# Patient Record
Sex: Female | Born: 1990 | Race: White | Hispanic: No | Marital: Single | Smoking: Current every day smoker
Health system: Southern US, Community
[De-identification: ages and names within clinical notes are randomized; demographics above are authoritative.]

## PROBLEM LIST (undated history)

## (undated) DIAGNOSIS — F909 Attention-deficit hyperactivity disorder, unspecified type: Secondary | ICD-10-CM

## (undated) DIAGNOSIS — F32A Depression, unspecified: Secondary | ICD-10-CM

## (undated) DIAGNOSIS — I10 Essential (primary) hypertension: Secondary | ICD-10-CM

## (undated) DIAGNOSIS — F329 Major depressive disorder, single episode, unspecified: Secondary | ICD-10-CM

## (undated) DIAGNOSIS — F988 Other specified behavioral and emotional disorders with onset usually occurring in childhood and adolescence: Secondary | ICD-10-CM

## (undated) DIAGNOSIS — F319 Bipolar disorder, unspecified: Secondary | ICD-10-CM

## (undated) HISTORY — PX: MOUTH SURGERY: SHX715

## (undated) HISTORY — PX: EXTERNAL EAR SURGERY: SHX627

---

## 2008-01-28 ENCOUNTER — Emergency Department: Payer: Self-pay | Admitting: Emergency Medicine

## 2010-06-28 ENCOUNTER — Emergency Department: Payer: Self-pay | Admitting: Emergency Medicine

## 2011-02-04 ENCOUNTER — Emergency Department: Payer: Self-pay | Admitting: Emergency Medicine

## 2011-09-18 ENCOUNTER — Emergency Department (HOSPITAL_COMMUNITY)
Admission: EM | Admit: 2011-09-18 | Discharge: 2011-09-18 | Disposition: A | Discharge status: Home / Self Care | Payer: Medicare Other | Attending: Emergency Medicine | Admitting: Emergency Medicine

## 2011-09-18 ENCOUNTER — Encounter (HOSPITAL_COMMUNITY): Payer: Self-pay | Admitting: *Deleted

## 2011-09-18 DIAGNOSIS — F3289 Other specified depressive episodes: Secondary | ICD-10-CM | POA: Insufficient documentation

## 2011-09-18 DIAGNOSIS — F329 Major depressive disorder, single episode, unspecified: Secondary | ICD-10-CM | POA: Insufficient documentation

## 2011-09-18 LAB — COMPREHENSIVE METABOLIC PANEL
AST: 17 U/L (ref 0–37)
BUN: 10 mg/dL (ref 6–23)
CO2: 27 mEq/L (ref 19–32)
Calcium: 9.4 mg/dL (ref 8.4–10.5)
Chloride: 106 mEq/L (ref 96–112)
Creatinine, Ser: 0.9 mg/dL (ref 0.50–1.10)
GFR calc Af Amer: >90 mL/min (ref >90)
GFR calc non Af Amer: >90 mL/min (ref >90)
Total Bilirubin: 0.9 mg/dL (ref 0.3–1.2)

## 2011-09-18 LAB — RAPID URINE DRUG SCREEN, HOSP PERFORMED
Amphetamines: NOT DETECTED
Benzodiazepines: NOT DETECTED
Opiates: NOT DETECTED

## 2011-09-18 LAB — CBC
HCT: 42.7 % (ref 36.0–46.0)
Hemoglobin: 15 g/dL (ref 12.0–15.0)
MCHC: 35.1 g/dL (ref 30.0–36.0)
MCV: 90.7 fL (ref 78.0–100.0)
RDW: 12.1 % (ref 11.5–15.5)

## 2011-09-18 LAB — DIFFERENTIAL
Basophils Absolute: 0 10*3/uL (ref 0.0–0.1)
Basophils Relative: 1 % (ref 0–1)
Eosinophils Relative: 1 % (ref 0–5)
Lymphocytes Relative: 26 % (ref 12–46)
Monocytes Absolute: 0.4 10*3/uL (ref 0.1–1.0)
Monocytes Relative: 7 % (ref 3–12)
Neutro Abs: 3.9 10*3/uL (ref 1.7–7.7)
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
## 2011-09-18 NOTE — ED Provider Notes (Addendum)
History     CSN: [id number]  Arrival date & time 09/18/11  5621   First MD Initiated Contact with Patient 09/18/11 1025      Chief Complaint  Patient presents with  . V70.1    (Consider location/radiation/quality/duration/timing/severity/associated sxs/prior treatment) HPI.... patient has known psychiatric problems.  She has been cutting on her hands but she does not want to kill herself. 3 previous psychiatric admissions. Lives in group home.  Has had trouble getting along with her family. Nothing makes it better or worse.  She thinks she needs a medication adjustment  History reviewed. No pertinent past medical history.  History reviewed. No pertinent past surgical history.  No family history on file.  History  Substance Use Topics  . Smoking status: Never Smoker   . Smokeless tobacco: Not on file  . Alcohol Use: No    OB History    Grav Para Term Preterm Abortions TAB SAB Ect Mult Living                  Review of Systems  All other systems reviewed and are negative.    Allergies  Review of patient's allergies indicates no known allergies.  Home Medications   Current Outpatient Rx  Name Route Sig Dispense Refill  . ARIPIPRAZOLE 30 MG PO TABS Oral Take 45 mg by mouth daily. Pt takes 1 and 1/2 tab of 30 mg to equal 45mg  dose.    Marland Kitchen ATOMOXETINE HCL 40 MG PO CAPS Oral Take 40 mg by mouth daily.    Marland Kitchen FLUOXETINE HCL 10 MG PO TABS Oral Take 5 mg by mouth daily. Pt takes 1/2 tab for 5 mg dose    . PORTIA-28 PO Oral Take 1 tablet by mouth daily.    Marland Kitchen PROPRANOLOL HCL 60 MG PO TABS Oral Take 120-180 mg by mouth 3 (three) times daily. Pt takes 2 in the morning. 3 tabs every morning    . RANITIDINE HCL 150 MG PO TABS Oral Take 150 mg by mouth 2 (two) times daily.    . TRIHEXYPHENIDYL HCL 5 MG PO TABS Oral Take 5 mg by mouth 2 (two) times daily with a meal.      BP 100/65  Pulse 74  Temp(Src) 98.2 F (36.8 C) (Oral)  Resp 18  Ht 4\' 11"  (1.499 m)  Wt 154 lb (69.854  kg)  BMI 31.10 kg/m2  SpO2 100%  LMP 09/18/2011  Physical Exam  Nursing note and vitals reviewed. Constitutional: She is oriented to person, place, and time. She appears well-developed and well-nourished.  HENT:  Head: Normocephalic and atraumatic.  Eyes: Conjunctivae and EOM are normal. Pupils are equal, round, and reactive to light.  Neck: Normal range of motion. Neck supple.  Cardiovascular: Normal rate and regular rhythm.   Pulmonary/Chest: Effort normal and breath sounds normal.  Abdominal: Soft. Bowel sounds are normal.  Musculoskeletal: Normal range of motion.  Neurological: She is alert and oriented to person, place, and time.  Skin: Skin is warm and dry.       Superficial scratches on dorsum of bilateral  Psychiatric:       Flat affect, depressed    ED Course  Procedures (including critical care time)   Labs Reviewed  CBC  DIFFERENTIAL  URINE RAPID DRUG SCREEN (HOSP PERFORMED)  ETHANOL  COMPREHENSIVE METABOLIC PANEL   No results found.   No diagnosis found.    MDM  Will obtain [facility] consult. Most likely admission   Behavioral health consult.  No suicidal or homicidal ideation. Can return to group home     Donnetta Hutching, MD 09/18/11 1104  Donnetta Hutching, MD 09/18/11 1215

## 2011-09-18 NOTE — BH Assessment (Addendum)
CSW recvd call from [facility] with [facility]. Dawn is bedside speaking with patient at this time.  Ileene Hutchinson , MSW, LCSWA 09/18/2011 11:41 AM [phone]  CSW spoke with patient and she would like to be discharged. Patient does not exhibit any SI/HI/AVH. Patient is cleared for discharge. Discussed with [facility] on call worker who agrees with disposition. CSW contacted Clydie Braun, group home [employer] [employer], who will come to pick patient up.  CSW advised patient. EDP and patient's nurse notified of discharge.  Ileene Hutchinson , MSW, LCSWA 09/18/2011 12:17 PM [phone]

## 2013-08-12 ENCOUNTER — Emergency Department (HOSPITAL_COMMUNITY)
Admission: EM | Admit: 2013-08-12 | Discharge: 2013-08-12 | Disposition: A | Discharge status: Home / Self Care | Payer: Medicare Other | Attending: Emergency Medicine | Admitting: Emergency Medicine

## 2013-08-12 ENCOUNTER — Encounter (HOSPITAL_COMMUNITY): Payer: Self-pay | Admitting: Emergency Medicine

## 2013-08-12 ENCOUNTER — Emergency Department (HOSPITAL_COMMUNITY): Payer: Medicare Other

## 2013-08-12 DIAGNOSIS — Y9389 Activity, other specified: Secondary | ICD-10-CM | POA: Insufficient documentation

## 2013-08-12 DIAGNOSIS — W2209XA Striking against other stationary object, initial encounter: Secondary | ICD-10-CM | POA: Insufficient documentation

## 2013-08-12 DIAGNOSIS — F329 Major depressive disorder, single episode, unspecified: Secondary | ICD-10-CM | POA: Insufficient documentation

## 2013-08-12 DIAGNOSIS — Z79899 Other long term (current) drug therapy: Secondary | ICD-10-CM | POA: Insufficient documentation

## 2013-08-12 DIAGNOSIS — S60222A Contusion of left hand, initial encounter: Secondary | ICD-10-CM

## 2013-08-12 DIAGNOSIS — Z23 Encounter for immunization: Secondary | ICD-10-CM | POA: Insufficient documentation

## 2013-08-12 DIAGNOSIS — F3289 Other specified depressive episodes: Secondary | ICD-10-CM | POA: Insufficient documentation

## 2013-08-12 DIAGNOSIS — S60229A Contusion of unspecified hand, initial encounter: Secondary | ICD-10-CM | POA: Insufficient documentation

## 2013-08-12 DIAGNOSIS — Y9210 Unspecified residential institution as the place of occurrence of the external cause: Secondary | ICD-10-CM | POA: Insufficient documentation

## 2013-08-12 DIAGNOSIS — S60512A Abrasion of left hand, initial encounter: Secondary | ICD-10-CM

## 2013-08-12 DIAGNOSIS — IMO0002 Reserved for concepts with insufficient information to code with codable children: Secondary | ICD-10-CM | POA: Insufficient documentation

## 2013-08-12 HISTORY — DX: Depression, unspecified: F32.A

## 2013-08-12 HISTORY — DX: Major depressive disorder, single episode, unspecified: F32.9

## 2013-08-12 MED ORDER — TETANUS-DIPHTH-ACELL PERTUSSIS 5-2.5-18.5 LF-MCG/0.5 IM SUSP
0.5000 mL | Freq: Once | INTRAMUSCULAR | Status: AC
  Administered 2013-08-12: 0.5 mL via INTRAMUSCULAR
  Filled 2013-08-12: qty 0.5

## 2013-08-12 MED ORDER — HYDROCODONE-ACETAMINOPHEN 5-325 MG PO TABS
1.0000 | ORAL_TABLET | Freq: Once | ORAL | Status: AC
  Administered 2013-08-12: 1 via ORAL
  Filled 2013-08-12: qty 1
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
## 2013-08-12 NOTE — ED Provider Notes (Signed)
CSN: [id number]     Arrival date & time 08/12/13  1631 History   First MD Initiated Contact with Patient 08/12/13 1641     Chief Complaint  Patient presents with  . Hand Pain   (Consider location/radiation/quality/duration/timing/severity/associated sxs/prior Treatment) Patient is a 22 y.o. female presenting with hand pain. The history is provided by the patient.  Hand Pain Pertinent negatives include no chest pain, no abdominal pain, no headaches and no shortness of breath.  pt c/o pain to dorsum left hand after punching a wall. Pt states she got upset, hit wall w left fist. Denies other injury. Pain constant, dull, non radiating, worse w palpation. Skin intact x for superficial abrasion. Pt is right hand dominant. Pt states is calm now, no thoughts of harm to self or others.      Past Medical History  Diagnosis Date  . Depression    History reviewed. No pertinent past surgical history. History reviewed. No pertinent family history. History  Substance Use Topics  . Smoking status: Never Smoker   . Smokeless tobacco: Not on file  . Alcohol Use: No   OB History   Grav Para Term Preterm Abortions TAB SAB Ect Mult Living                 Review of Systems  Constitutional: Negative for fever.  HENT: Negative for nosebleeds.   Eyes: Negative for redness.  Respiratory: Negative for shortness of breath.   Cardiovascular: Negative for chest pain.  Gastrointestinal: Negative for vomiting and abdominal pain.  Genitourinary: Negative for flank pain.  Musculoskeletal: Negative for back pain and neck pain.  Skin: Negative for rash.  Neurological: Negative for weakness, numbness and headaches.  Hematological: Does not bruise/bleed easily.  Psychiatric/Behavioral: Negative for confusion.    Allergies  Review of patient's allergies indicates no known allergies.  Home Medications   Current Outpatient Rx  Name  Route  Sig  Dispense  Refill  . ARIPiprazole (ABILIFY) 30 MG tablet   Oral   Take 45 mg by mouth daily. Pt takes 1 and 1/2 tab of 30 mg to equal 45mg  dose.         Marland Kitchen atomoxetine (STRATTERA) 40 MG capsule   Oral   Take 40 mg by mouth daily.         Marland Kitchen FLUoxetine (PROZAC) 10 MG tablet   Oral   Take 5 mg by mouth daily. Pt takes 1/2 tab for 5 mg dose         . Levonorgestrel-Ethinyl Estrad (PORTIA-28 PO)   Oral   Take 1 tablet by mouth daily.         . propranolol (INDERAL) 60 MG tablet   Oral   Take 120-180 mg by mouth 3 (three) times daily. Pt takes 2 in the morning. 3 tabs every morning         . ranitidine (ZANTAC) 150 MG tablet   Oral   Take 150 mg by mouth 2 (two) times daily.         . trihexyphenidyl (ARTANE) 5 MG tablet   Oral   Take 5 mg by mouth 2 (two) times daily with a meal.          BP 112/75  Pulse 83  Temp(Src) 98.4 F (36.9 C) (Oral)  Resp 16  SpO2 99%  LMP 08/08/2013 Physical Exam  Nursing note and vitals reviewed. Constitutional: She is oriented to person, place, and time. She appears well-developed and well-nourished. No distress.  HENT:  Head: Atraumatic.  Eyes: Conjunctivae are normal. No scleral icterus.  Neck: Neck supple. No tracheal deviation present.  Cardiovascular: Normal rate.   Pulmonary/Chest: Effort normal. No respiratory distress.  Abdominal: Soft. Normal appearance. She exhibits no distension. There is no tenderness.  Musculoskeletal: She exhibits no edema.  Sts, tenderness left hand, esp over 3rd metacarpal distally. Superficial abrasion without sign of infection.     Neurological: She is alert and oriented to person, place, and time.  Steady gait.   Skin: Skin is warm and dry. No rash noted. She is not diaphoretic.  Psychiatric: She has a normal mood and affect.    ED Course  Procedures (including critical care time)  Dg Hand Complete Left  08/12/2013   CLINICAL DATA:  Pain post trauma  EXAM: LEFT HAND - COMPLETE 3+ VIEW  COMPARISON:  None.  FINDINGS: Frontal, oblique, and  lateral views were obtained. There is no fracture or dislocation. Joint spaces appear intact. No erosive change.  IMPRESSION: No abnormality noted.   Electronically Signed   By: Bretta Bang M.D.   On: 08/12/2013 16:50     EKG Interpretation   None       MDM  Xrays.   vicodin 1 po. Icepack.  Reviewed nursing notes and prior charts for additional history.   Wound cleaned, tetanus updated.  Recheck comfortable. No new c/o.     Suzi Roots, MD 08/14/13 [phone]

## 2013-08-12 NOTE — Progress Notes (Signed)
   CARE MANAGEMENT ED NOTE 08/12/2013  Patient:  PEGGIE, HORNAK   Account Number:  [id number]  Date Initiated:  08/12/2013  Documentation initiated by:  Radford Pax  Subjective/Objective Assessment:   Patient presenst to Ed with hand pain.     Subjective/Objective Assessment Detail:     Action/Plan:   Action/Plan Detail:   Anticipated DC Date:       Status Recommendation to Physician:   Result of Recommendation:    Other ED Services  Consult Working Plan    DC Planning Services  Other  PCP issues    Choice offered to / List presented to:            Status of service:  Completed, signed off  ED Comments:   ED Comments Detail:  Patient confirms she does not have a pcp.  Patient reports she is from a group home.  EDCM provided patient with a list of pcps who accept Medicare insurance within a ten mile radius of her zip code.  EDCM explained importance of having a pcp.  Patient thankful for resources.
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
## 2013-08-18 ENCOUNTER — Emergency Department (HOSPITAL_COMMUNITY): Payer: Medicare Other

## 2013-08-18 ENCOUNTER — Encounter (HOSPITAL_COMMUNITY): Payer: Self-pay | Admitting: Emergency Medicine

## 2013-08-18 ENCOUNTER — Emergency Department (HOSPITAL_COMMUNITY)
Admission: EM | Admit: 2013-08-18 | Discharge: 2013-08-19 | Disposition: A | Discharge status: Home / Self Care | Payer: Medicare Other | Attending: Emergency Medicine | Admitting: Emergency Medicine

## 2013-08-18 DIAGNOSIS — F29 Unspecified psychosis not due to a substance or known physiological condition: Secondary | ICD-10-CM | POA: Insufficient documentation

## 2013-08-18 DIAGNOSIS — Z7289 Other problems related to lifestyle: Secondary | ICD-10-CM

## 2013-08-18 DIAGNOSIS — S60229A Contusion of unspecified hand, initial encounter: Secondary | ICD-10-CM | POA: Insufficient documentation

## 2013-08-18 DIAGNOSIS — IMO0002 Reserved for concepts with insufficient information to code with codable children: Secondary | ICD-10-CM | POA: Insufficient documentation

## 2013-08-18 DIAGNOSIS — X789XXA Intentional self-harm by unspecified sharp object, initial encounter: Secondary | ICD-10-CM | POA: Insufficient documentation

## 2013-08-18 DIAGNOSIS — F3289 Other specified depressive episodes: Secondary | ICD-10-CM | POA: Insufficient documentation

## 2013-08-18 DIAGNOSIS — Z3202 Encounter for pregnancy test, result negative: Secondary | ICD-10-CM | POA: Insufficient documentation

## 2013-08-18 DIAGNOSIS — F329 Major depressive disorder, single episode, unspecified: Secondary | ICD-10-CM | POA: Insufficient documentation

## 2013-08-18 DIAGNOSIS — R4689 Other symptoms and signs involving appearance and behavior: Secondary | ICD-10-CM

## 2013-08-18 DIAGNOSIS — Z79899 Other long term (current) drug therapy: Secondary | ICD-10-CM | POA: Insufficient documentation

## 2013-08-18 LAB — COMPREHENSIVE METABOLIC PANEL
ALT: 31 U/L (ref 0–35)
AST: 25 U/L (ref 0–37)
Albumin: 3.7 g/dL (ref 3.5–5.2)
Alkaline Phosphatase: 108 U/L (ref 39–117)
CO2: 28 mEq/L (ref 19–32)
Chloride: 102 mEq/L (ref 96–112)
Creatinine, Ser: 0.9 mg/dL (ref 0.50–1.10)
GFR calc non Af Amer: 89 mL/min — ABNORMAL LOW (ref >90)
Potassium: 4 mEq/L (ref 3.5–5.1)
Sodium: 138 mEq/L (ref 135–145)
Total Bilirubin: 0.6 mg/dL (ref 0.3–1.2)

## 2013-08-18 LAB — CBC
Platelets: 245 10*3/uL (ref 150–400)
RBC: 4.47 MIL/uL (ref 3.87–5.11)
RDW: 11.7 % (ref 11.5–15.5)
WBC: 12 10*3/uL — ABNORMAL HIGH (ref 4.0–10.5)

## 2013-08-18 LAB — RAPID URINE DRUG SCREEN, HOSP PERFORMED
Amphetamines: NOT DETECTED
Benzodiazepines: NOT DETECTED
Tetrahydrocannabinol: NOT DETECTED

## 2013-08-18 LAB — SALICYLATE LEVEL: Salicylate Lvl: <2 mg/dL — ABNORMAL LOW (ref 2.8–20.0)

## 2013-08-18 LAB — POCT PREGNANCY, URINE: Preg Test, Ur: NEGATIVE

## 2013-08-18 LAB — ACETAMINOPHEN LEVEL: Acetaminophen (Tylenol), Serum: <15 ug/mL (ref 10–30)

## 2013-08-18 MED ORDER — ARIPIPRAZOLE 15 MG PO TABS
45.0000 mg | ORAL_TABLET | Freq: Every day | ORAL | Status: DC
  Filled 2013-08-18: qty 3

## 2013-08-18 MED ORDER — TRIHEXYPHENIDYL HCL 2 MG PO TABS
2.0000 mg | ORAL_TABLET | Freq: Every day | ORAL | Status: DC
  Filled 2013-08-18: qty 1

## 2013-08-18 MED ORDER — ATOMOXETINE HCL 40 MG PO CAPS
40.0000 mg | ORAL_CAPSULE | Freq: Every day | ORAL | Status: DC
  Filled 2013-08-18: qty 1

## 2013-08-18 MED ORDER — ONDANSETRON HCL 4 MG PO TABS: 4.0000 mg | ORAL_TABLET | Freq: Three times a day (TID) | ORAL | Status: DC | PRN

## 2013-08-18 MED ORDER — ACETAMINOPHEN 325 MG PO TABS: 650.0000 mg | ORAL_TABLET | ORAL | Status: DC | PRN

## 2013-08-18 MED ORDER — FLUOXETINE HCL 20 MG/5ML PO SOLN
5.0000 mg | Freq: Every day | ORAL | Status: DC
  Filled 2013-08-18: qty 5

## 2013-08-18 MED ORDER — LORAZEPAM 1 MG PO TABS: 1.0000 mg | ORAL_TABLET | Freq: Three times a day (TID) | ORAL | Status: DC | PRN

## 2013-08-18 MED ORDER — PROPRANOLOL HCL 60 MG PO TABS
60.0000 mg | ORAL_TABLET | Freq: Two times a day (BID) | ORAL | Status: DC
  Administered 2013-08-18: 60 mg via ORAL
  Filled 2013-08-18 (×3): qty 1

## 2013-08-18 MED ORDER — IBUPROFEN 200 MG PO TABS: 600.0000 mg | ORAL_TABLET | Freq: Three times a day (TID) | ORAL | Status: DC | PRN

## 2013-08-18 MED ORDER — FAMOTIDINE 20 MG PO TABS: 10.0000 mg | ORAL_TABLET | Freq: Every day | ORAL | Status: DC

## 2013-08-18 MED ORDER — LEVONORGESTREL-ETHINYL ESTRAD 0.15-30 MG-MCG PO TABS: 1.0000 | ORAL_TABLET | Freq: Every day | ORAL | Status: DC

## 2013-08-18 NOTE — Progress Notes (Signed)
Patient reports that her mother his her guardian.
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
## 2013-08-18 NOTE — Progress Notes (Signed)
Writer completed the demographic information on the First Exam for the ER MD.  Writer informed the nurse working with the patient that the ER MD would give her the completed and signed First Examination.
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
## 2013-08-19 DIAGNOSIS — F909 Attention-deficit hyperactivity disorder, unspecified type: Secondary | ICD-10-CM

## 2013-08-19 DIAGNOSIS — F319 Bipolar disorder, unspecified: Secondary | ICD-10-CM

## 2013-08-19 DIAGNOSIS — F840 Autistic disorder: Secondary | ICD-10-CM

## 2013-08-19 NOTE — Progress Notes (Signed)
   CARE MANAGEMENT ED NOTE 08/19/2013  Patient:  CHABLIS, LOSH   Account Number:  [id number]  Date Initiated:  08/19/2013  Documentation initiated by:  Edd Arbour  Subjective/Objective Assessment:   22 yr old medicare pt without pcp listed in EPIC Pt seen on 08/12/13 by ED PM CM and provided with [place] [facility] but pt states she still has not obtained a pcp     Subjective/Objective Assessment Detail:   2 ED visits in last 6 months no admissions     Action/Plan:   Action/Plan Detail:   Anticipated DC Date:  08/19/2013     Status Recommendation to Physician:   Result of Recommendation:    Other ED Services  Consult Working Plan    DC Planning Services  Other  PCP issues    Choice offered to / List presented to:            Status of service:  Completed, signed off  ED Comments:   ED Comments Detail:

## 2013-08-19 NOTE — Consult Note (Signed)
Pt was interviewed with NP Josephine. Chart reviewed. Above assessment and plan reviewed. Pt reports being brought to the ED for "attention seeking" behavior. She reports cutting her left hand with a rock, but denies intent to die. Her mood is "calm" currently. She misses her mom (who lives in [place]), so she will call her mom as soon as she leaves the hospital today. She denies current SI/HI/AVH. She denies a history of suicide attempts. Will discharge pt to group home, with outpatient follow up. Will rescind IVC, since pt is currently calm and cooperative in ED.

## 2013-08-19 NOTE — BH Assessment (Signed)
[facility] Assessment Progress Note 08/19/13. 1145.  Pt has been evaluated by Julieanne Cotton and Dr Langley Adie, who has reversed the IVC.  Dr Jodi Mourning of [facility] informed and will discharge pt.  Group home contacted and they will come pick pt up. Daleen Squibb, LCSW

## 2013-08-19 NOTE — ED Provider Notes (Signed)
Medical screening examination/treatment/procedure(s) were performed by non-physician practitioner and as supervising physician I was immediately available for consultation/collaboration.  EKG Interpretation   None         Audree Camel, MD 08/19/13 [phone]
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
## 2013-12-23 ENCOUNTER — Encounter (HOSPITAL_COMMUNITY): Payer: Self-pay | Admitting: Emergency Medicine

## 2013-12-23 ENCOUNTER — Emergency Department (HOSPITAL_COMMUNITY)
Admission: EM | Admit: 2013-12-23 | Discharge: 2013-12-23 | Disposition: A | Discharge status: Home / Self Care | Payer: Medicare Other | Attending: Emergency Medicine | Admitting: Emergency Medicine

## 2013-12-23 DIAGNOSIS — F3289 Other specified depressive episodes: Secondary | ICD-10-CM | POA: Insufficient documentation

## 2013-12-23 DIAGNOSIS — Z23 Encounter for immunization: Secondary | ICD-10-CM | POA: Insufficient documentation

## 2013-12-23 DIAGNOSIS — Y929 Unspecified place or not applicable: Secondary | ICD-10-CM | POA: Insufficient documentation

## 2013-12-23 DIAGNOSIS — IMO0002 Reserved for concepts with insufficient information to code with codable children: Secondary | ICD-10-CM

## 2013-12-23 DIAGNOSIS — Y9389 Activity, other specified: Secondary | ICD-10-CM | POA: Insufficient documentation

## 2013-12-23 DIAGNOSIS — X58XXXA Exposure to other specified factors, initial encounter: Secondary | ICD-10-CM | POA: Insufficient documentation

## 2013-12-23 DIAGNOSIS — F329 Major depressive disorder, single episode, unspecified: Secondary | ICD-10-CM | POA: Insufficient documentation

## 2013-12-23 DIAGNOSIS — S01309A Unspecified open wound of unspecified ear, initial encounter: Secondary | ICD-10-CM | POA: Insufficient documentation

## 2013-12-23 DIAGNOSIS — Z79899 Other long term (current) drug therapy: Secondary | ICD-10-CM | POA: Insufficient documentation

## 2013-12-23 MED ORDER — TETANUS-DIPHTH-ACELL PERTUSSIS 5-2.5-18.5 LF-MCG/0.5 IM SUSP
0.5000 mL | Freq: Once | INTRAMUSCULAR | Status: AC
  Administered 2013-12-23: 0.5 mL via INTRAMUSCULAR
  Filled 2013-12-23: qty 0.5

## 2013-12-23 NOTE — Discharge Instructions (Signed)
Laceration Care, Adult °A laceration is a cut that goes through all layers of the skin. The cut goes into the tissue beneath the skin. °HOME CARE °For stitches (sutures) or staples: °· Keep the cut clean and dry. °· If you have a bandage (dressing), change it at least once a day. Change the bandage if it gets wet or dirty, or as told by your doctor. °· Wash the cut with soap and water 2 times a day. Rinse the cut with water. Pat it dry with a clean towel. °· Put a thin layer of medicated cream on the cut as told by your doctor. °· You may shower after the first 24 hours. Do not soak the cut in water until the stitches are removed. °· Only take medicines as told by your doctor. °· Have your stitches or staples removed as told by your doctor. °For skin adhesive strips: °· Keep the cut clean and dry. °· Do not get the strips wet. You may take a bath, but be careful to keep the cut dry. °· If the cut gets wet, pat it dry with a clean towel. °· The strips will fall off on their own. Do not remove the strips that are still stuck to the cut. °For wound glue: °· You may shower or take baths. Do not soak or scrub the cut. Do not swim. Avoid heavy sweating until the glue falls off on its own. After a shower or bath, pat the cut dry with a clean towel. °· Do not put medicine on your cut until the glue falls off. °· If you have a bandage, do not put tape over the glue. °· Avoid lots of sunlight or tanning lamps until the glue falls off. Put sunscreen on the cut for the first year to reduce your scar. °· The glue will fall off on its own. Do not pick at the glue. °You may need a tetanus shot if: °· You cannot remember when you had your last tetanus shot. °· You have never had a tetanus shot. °If you need a tetanus shot and you choose not to have one, you may get tetanus. Sickness from tetanus can be serious. °GET HELP RIGHT AWAY IF:  °· Your pain does not get better with medicine. °· Your arm, hand, leg, or foot loses feeling  (numbness) or changes color. °· Your cut is bleeding. °· Your joint feels weak, or you cannot use your joint. °· You have painful lumps on your body. °· Your cut is red, puffy (swollen), or painful. °· You have a red line on the skin near the cut. °· You have yellowish-white fluid (pus) coming from the cut. °· You have a fever. °· You have a bad smell coming from the cut or bandage. °· Your cut breaks open before or after stitches are removed. °· You notice something coming out of the cut, such as wood or glass. °· You cannot move a finger or toe. °MAKE SURE YOU:  °· Understand these instructions. °· Will watch your condition. °· Will get help right away if you are not doing well or get worse. °Document Released: 02/11/2008 Document Revised: 11/17/2011 Document Reviewed: 02/18/2011 °[employer]® Patient Information ©2014 [employer]. ° ° ° ° °

## 2013-12-23 NOTE — ED Provider Notes (Signed)
CSN: [id number]     Arrival date & time 12/23/13  1717 History  This chart was scribed for non-physician practitioner Junious SilkHannah Rasheida Broden, PA-C working with Shelda JakesScott W. Zackowski, MD by Joaquin MusicKristina Sanchez-Matthews, ED Scribe. This patient was seen in room TR06C/TR06C and the patient's care was started at 6:05 PM .   Chief Complaint  Patient presents with  . Ear Injury   The history is provided by the patient. No language interpreter was used.   HPI Comments: Brooke Holt is a 23 y.o. female who presents to the Emergency Department complaining of L ear injury that occurred just prior to arrival. Pt states she pulled her recently pierced earring out of her L ear accidently. Describes her current pain as a constant burning sensation. Pt is unsure when her last tetanus was. Pt denies having any medical conditions. She denies any other injuries.   Past Medical History  Diagnosis Date  . Depression    History reviewed. No pertinent past surgical history. History reviewed. No pertinent family history. History  Substance Use Topics  . Smoking status: Never Smoker   . Smokeless tobacco: Not on file  . Alcohol Use: No   OB History   Grav Para Term Preterm Abortions TAB SAB Ect Mult Living                 Review of Systems  Skin: Positive for wound. Negative for color change.  All other systems reviewed and are negative.   Allergies  Review of patient's allergies indicates no known allergies.  Home Medications   Prior to Admission medications   Medication Sig Start Date End Date Taking? Authorizing Provider  ARIPiprazole (ABILIFY) 30 MG tablet Take 45 mg by mouth daily. Pt takes 1 and 1/2 tab of 30 mg to equal 45mg  dose.    Historical Provider, MD  atomoxetine (STRATTERA) 40 MG capsule Take 40 mg by mouth daily.    Historical Provider, MD  FLUoxetine (PROZAC) 10 MG tablet Take 5 mg by mouth daily. Pt takes 1/2 tab for 5 mg dose    Historical Provider, MD  levonorgestrel-ethinyl estradiol  (NORDETTE) 0.15-30 MG-MCG tablet Take 1 tablet by mouth daily.    Historical Provider, MD  propranolol (INDERAL) 60 MG tablet Take 60 mg by mouth 2 (two) times daily.     Historical Provider, MD  ranitidine (ZANTAC) 150 MG tablet Take 150 mg by mouth 2 (two) times daily.    Historical Provider, MD  trihexyphenidyl (ARTANE) 2 MG tablet Take 2 mg by mouth daily.    Historical Provider, MD   BP 106/71  Pulse 77  Temp(Src) 98.3 F (36.8 C)  Resp 18  SpO2 98%  Physical Exam  Nursing note and vitals reviewed. Constitutional: She is oriented to person, place, and time. She appears well-developed and well-nourished. No distress.  HENT:  Head: Normocephalic and atraumatic.  L ear lobe ripped, split open at most inferior part of lobe. No drainage, bleeding, or erythema at this time.  Eyes: EOM are normal.  Neck: Neck supple. No tracheal deviation present.  Cardiovascular: Normal rate.   Pulmonary/Chest: Effort normal. No respiratory distress.  Musculoskeletal: Normal range of motion.  Neurological: She is alert and oriented to person, place, and time.  Skin: Skin is warm and dry.  Psychiatric: She has a normal mood and affect. Her behavior is normal.   ED Course  Procedures  DIAGNOSTIC STUDIES: Oxygen Saturation is 98% on RA, normal by my interpretation.    COORDINATION OF CARE:  6:07 PM-Discussed treatment plan which includes apply a stick to L ear lobe and update pts tetanus shot. Pt agreed to plan.   6:31 PM- LACERATION REPAIR Performed by: Junious SilkHannah Mamye Bolds, PA-C Consent: Verbal consent obtained. Risks and benefits: risks, benefits and alternatives were discussed Patient identity confirmed: provided demographic data Time out performed prior to procedure Prepped and Draped in normal sterile fashion Wound explored Laceration Location: L ear lobe Laceration Length: 1cm No Foreign Bodies seen or palpated Anesthesia: local infiltration Local anesthetic: lidocaine 2% without  epinephrine Anesthetic total: 2 ml Irrigation method: syringe Amount of cleaning: standard Skin closure: 4.0 Ethilon   Number of sutures: 1 Technique: Simple interrupted  Patient tolerance: Patient tolerated the procedure well with no immediate complications.  6:44 PM-Informed pt to keep area clean and dry. Advised pt to have suture removed in 5 days. Pt agreed to plan.   Labs Review Labs Reviewed - No data to display  Imaging Review No results found.   EKG Interpretation None     MDM   Final diagnoses:  Laceration    Tdap booster given. Wound cleaning complete with pressure irrigation, bottom of wound visualized, no foreign bodies appreciated. Laceration occurred < 8 hours prior to repair which was well tolerated. Pt has no co morbidities to effect normal wound healing. Discussed suture home care w pt and answered questions. Pt to f-u for wound check and suture removal in 5 days. Pt is hemodynamically stable w no complaints prior to dc.   I personally performed the services described in this documentation, which was scribed in my presence. The recorded information has been reviewed and is accurate.    Mora BellmanHannah S Skylor Hughson, PA-C 12/24/13 [phone]
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
## 2013-12-25 NOTE — ED Provider Notes (Signed)
Medical screening examination/treatment/procedure(s) were performed by non-physician practitioner and as supervising physician I was immediately available for consultation/collaboration.   EKG Interpretation None        Shelda JakesScott W. Janey Petron, MD 12/25/13 1539

## 2013-12-29 ENCOUNTER — Emergency Department (HOSPITAL_COMMUNITY)
Admission: EM | Admit: 2013-12-29 | Discharge: 2013-12-29 | Disposition: A | Discharge status: Home / Self Care | Payer: Medicare Other | Attending: Emergency Medicine | Admitting: Emergency Medicine

## 2013-12-29 ENCOUNTER — Encounter (HOSPITAL_COMMUNITY): Payer: Self-pay | Admitting: Emergency Medicine

## 2013-12-29 DIAGNOSIS — F329 Major depressive disorder, single episode, unspecified: Secondary | ICD-10-CM | POA: Insufficient documentation

## 2013-12-29 DIAGNOSIS — Z4801 Encounter for change or removal of surgical wound dressing: Secondary | ICD-10-CM | POA: Insufficient documentation

## 2013-12-29 DIAGNOSIS — Z5189 Encounter for other specified aftercare: Secondary | ICD-10-CM

## 2013-12-29 DIAGNOSIS — F3289 Other specified depressive episodes: Secondary | ICD-10-CM | POA: Insufficient documentation

## 2013-12-29 DIAGNOSIS — Z79899 Other long term (current) drug therapy: Secondary | ICD-10-CM | POA: Insufficient documentation

## 2013-12-29 NOTE — Discharge Instructions (Signed)
Sutured Wound Care Sutures are stitches that can be used to close wounds. Wound care helps prevent pain and infection.  HOME CARE INSTRUCTIONS   Rest and elevate the injured area until all the pain and swelling are gone.  Only take over-the-counter or prescription medicines for pain, discomfort, or fever as directed by your caregiver.  After 48 hours, gently wash the area with mild soap and water once a day, or as directed. Rinse off the soap. Pat the area dry with a clean towel. Do not rub the wound. This may cause bleeding.  Follow your caregiver's instructions for how often to change the bandage (dressing). Stop using a dressing after 2 days or after the wound stops draining.  If the dressing sticks, moisten it with soapy water and gently remove it.  Apply ointment on the wound as directed.  Avoid stretching a sutured wound.  Drink enough fluids to keep your urine clear or pale yellow.  Follow up with your caregiver for suture removal as directed.  Use sunscreen on your wound for the next 3 to 6 months so the scar will not darken. SEEK IMMEDIATE MEDICAL CARE IF:   Your wound becomes red, swollen, hot, or tender.  You have increasing pain in the wound.  You have a red streak that extends from the wound.  There is pus coming from the wound.  You have a fever.  You have shaking chills.  There is a bad smell coming from the wound.  You have persistent bleeding from the wound. MAKE SURE YOU:   Understand these instructions.  Will watch your condition.  Will get help right away if you are not doing well or get worse. Document Released: 10/02/2004 Document Revised: 11/17/2011 Document Reviewed: 12/29/2010 [facility] Patient Information 2014 [place], [place]. Your wound is not healed enough to remove sutures.  Please return in 5-7 days.  Continue your present.  Wound care regime

## 2013-12-29 NOTE — ED Provider Notes (Signed)
CSN: [id number]     Arrival date & time 12/29/13  1655 History  This chart was scribed for non-physician practitioner, Earley FavorGail Ollivander See, FNP working with Shon Batonourtney F Horton, MD by Greggory StallionKayla Andersen, ED scribe. This patient was seen in room TR07C/TR07C and the patient's care was started at 5:43 PM.   Chief Complaint  Patient presents with  . Suture / Staple Removal   The history is provided by the patient. No language interpreter was used.   HPI Comments: Brooke Holt is a 23 y.o. female who presents to the Emergency Department for suture removal. Pt had one suture placed to her left ear 6 days ago after accidentally pulling her earring out. Denies any drainage or pain.    Past Medical History  Diagnosis Date  . Depression    History reviewed. No pertinent past surgical history. No family history on file. History  Substance Use Topics  . Smoking status: Never Smoker   . Smokeless tobacco: Not on file  . Alcohol Use: No   OB History   Grav Para Term Preterm Abortions TAB SAB Ect Mult Living                 Review of Systems  Skin: Positive for wound (healing).  All other systems reviewed and are negative.  Allergies  Review of patient's allergies indicates no known allergies.  Home Medications   Prior to Admission medications   Medication Sig Start Date End Date Taking? Authorizing Provider  ARIPiprazole (ABILIFY) 30 MG tablet Take 45 mg by mouth daily. Pt takes 1 and 1/2 tab of 30 mg to equal 45mg  dose.    Historical Provider, MD  atomoxetine (STRATTERA) 40 MG capsule Take 40 mg by mouth daily.    Historical Provider, MD  FLUoxetine (PROZAC) 10 MG tablet Take 5 mg by mouth daily. Pt takes 1/2 tab for 5 mg dose    Historical Provider, MD  levonorgestrel-ethinyl estradiol (NORDETTE) 0.15-30 MG-MCG tablet Take 1 tablet by mouth daily.    Historical Provider, MD  propranolol (INDERAL) 60 MG tablet Take 60 mg by mouth 2 (two) times daily.     Historical Provider, MD  ranitidine  (ZANTAC) 150 MG tablet Take 150 mg by mouth 2 (two) times daily.    Historical Provider, MD  trihexyphenidyl (ARTANE) 2 MG tablet Take 2 mg by mouth daily.    Historical Provider, MD   BP 113/81  Pulse 70  Temp(Src) 98.1 F (36.7 C) (Oral)  Resp 18  SpO2 100%  LMP 12/29/2013  Physical Exam  Nursing note and vitals reviewed. Constitutional: She is oriented to person, place, and time. She appears well-developed and well-nourished. No distress.  HENT:  Head: Normocephalic and atraumatic.  Healing laceration to left ear. Skin not totally closed. No drainage. No erythema.   Eyes: EOM are normal.  Neck: Neck supple. No tracheal deviation present.  Cardiovascular: Normal rate.   Pulmonary/Chest: Effort normal. No respiratory distress.  Musculoskeletal: Normal range of motion.  Neurological: She is alert and oriented to person, place, and time.  Skin: Skin is warm and dry.  Psychiatric: She has a normal mood and affect. Her behavior is normal.    ED Course  Procedures (including critical care time)  DIAGNOSTIC STUDIES: Oxygen Saturation is 100% on RA, normal by my interpretation.    COORDINATION OF CARE: 5:44 PM-Discussed treatment plan which includes leaving suture in for 5-7 more days with pt at bedside and pt agreed to plan.   Labs Review Labs  Reviewed - No data to display  Imaging Review No results found.   EKG Interpretation None      MDM  Wound.  Has not healed enough to remove sutures at this time.  She has been instructed to return in 5-7 days.  She is to continued her wound care regime Final diagnoses:  None      I personally performed the services described in this documentation, which was scribed in my presence. The recorded information has been reviewed and is accurate.  Arman FilterGail K Brittain Smithey, NP 12/29/13 [phone]

## 2013-12-29 NOTE — ED Notes (Signed)
Pt here for removal of stiches to left ear. Stiches placed appx 1 week. Denies S/S infection.

## 2013-12-30 NOTE — ED Provider Notes (Signed)
Medical screening examination/treatment/procedure(s) were performed by non-physician practitioner and as supervising physician I was immediately available for consultation/collaboration.   EKG Interpretation None       Shon Batonourtney F Horton, MD 12/30/13 1436

## 2014-01-04 ENCOUNTER — Emergency Department (HOSPITAL_COMMUNITY)
Admission: EM | Admit: 2014-01-04 | Discharge: 2014-01-04 | Disposition: A | Discharge status: Home / Self Care | Payer: Medicare Other | Attending: Emergency Medicine | Admitting: Emergency Medicine

## 2014-01-04 ENCOUNTER — Encounter (HOSPITAL_COMMUNITY): Payer: Self-pay | Admitting: Emergency Medicine

## 2014-01-04 DIAGNOSIS — Z79899 Other long term (current) drug therapy: Secondary | ICD-10-CM | POA: Insufficient documentation

## 2014-01-04 DIAGNOSIS — F329 Major depressive disorder, single episode, unspecified: Secondary | ICD-10-CM | POA: Insufficient documentation

## 2014-01-04 DIAGNOSIS — Z4802 Encounter for removal of sutures: Secondary | ICD-10-CM | POA: Insufficient documentation

## 2014-01-04 DIAGNOSIS — F3289 Other specified depressive episodes: Secondary | ICD-10-CM | POA: Insufficient documentation

## 2014-01-04 DIAGNOSIS — Z4889 Encounter for other specified surgical aftercare: Secondary | ICD-10-CM

## 2014-01-04 NOTE — ED Provider Notes (Signed)
Medical screening examination/treatment/procedure(s) were performed by non-physician practitioner and as supervising physician I was immediately available for consultation/collaboration.   EKG Interpretation None       Ethelda ChickMartha K Linker, MD 01/04/14 1701

## 2014-01-04 NOTE — Discharge Instructions (Signed)
Call for a follow up appointment with a Family or Primary Care Provider.  Return if Symptoms worsen.   Take medication as prescribed.  Do not scrub your ear, blot to dry.

## 2014-01-04 NOTE — ED Provider Notes (Signed)
CSN: [id number]     Arrival date & time 01/04/14  1540 History  This chart was scribed for non-physician practitioner Mellody DrownLauren Kylian Loh working with Ethelda ChickMartha K Linker, MD by Carl Bestelina Holson, ED Scribe. This patient was seen in room TR09C/TR09C and the patient's care was started at 3:47 PM.     No chief complaint on file.  HPI Comments: Brooke Holt is a 23 y.o. female who presents to the Emergency Department for suture removal from the patient's left earlobe.  She states that she came to the ED on April 23 but the sutures were not ready to be removed.  The patient denies fever, chills, or drainage from the wound as associated symptoms.    The history is provided by the patient. No language interpreter was used.    Past Medical History  Diagnosis Date  . Depression    No past surgical history on file. No family history on file. History  Substance Use Topics  . Smoking status: Never Smoker   . Smokeless tobacco: Not on file  . Alcohol Use: No   OB History   Grav Para Term Preterm Abortions TAB SAB Ect Mult Living                 Review of Systems  Constitutional: Negative for fever and chills.  Skin: Positive for wound.  All other systems reviewed and are negative.     Allergies  Review of patient's allergies indicates no known allergies.  Home Medications   Prior to Admission medications   Medication Sig Start Date End Date Taking? Authorizing Provider  ARIPiprazole (ABILIFY) 30 MG tablet Take 45 mg by mouth daily. Pt takes 1 and 1/2 tab of 30 mg to equal 45mg  dose.    Historical Provider, MD  atomoxetine (STRATTERA) 40 MG capsule Take 40 mg by mouth daily.    Historical Provider, MD  FLUoxetine (PROZAC) 10 MG tablet Take 5 mg by mouth daily. Pt takes 1/2 tab for 5 mg dose    Historical Provider, MD  levonorgestrel-ethinyl estradiol (NORDETTE) 0.15-30 MG-MCG tablet Take 1 tablet by mouth daily.    Historical Provider, MD  propranolol (INDERAL) 60 MG tablet Take 60 mg by  mouth 2 (two) times daily.     Historical Provider, MD  ranitidine (ZANTAC) 150 MG tablet Take 150 mg by mouth 2 (two) times daily.    Historical Provider, MD  trihexyphenidyl (ARTANE) 2 MG tablet Take 2 mg by mouth daily.    Historical Provider, MD   Triage Vitals: BP 95/70  Pulse 78  Temp(Src) 97.4 F (36.3 C) (Oral)  Resp 18  SpO2 97%  LMP 12/29/2013  Physical Exam  Nursing note and vitals reviewed. Constitutional: She is oriented to person, place, and time. She appears well-developed and well-nourished. No distress.  HENT:  Head: Normocephalic.  Ears:  Left ear: 1 suture in place to the left auricle lobule.  No drainage.  No erythema. Anterior auricle with scar., posterior shows scab and approximating edges of laceration.  Eyes: Conjunctivae and EOM are normal. Pupils are equal, round, and reactive to light.  Neck: Normal range of motion. Neck supple.  Pulmonary/Chest: Effort normal. No respiratory distress.  Musculoskeletal: Normal range of motion.  Neurological: She is alert and oriented to person, place, and time.  Skin: Skin is warm and dry. She is not diaphoretic.  Psychiatric: She has a normal mood and affect. Her behavior is normal.    ED Course  Procedures (including critical care time)  COORDINATION OF CARE: 3:49 PM- Discussed removing the sutures and applying steri strips to the patient's left earlobe.  Advised the patient not to wear earrings or scrub the area.  The patient agreed to the treatment plan.     MDM   Final diagnoses:  Suture check   Staple removal   Pt to ER for staple/suture removal and wound check as above. Procedure tolerated well. Vitals normal, no signs of infection. Scar minimization & return precautions given at dc.   I personally performed the services described in this documentation, which was scribed in my presence. The recorded information has been reviewed and is accurate.    Clabe SealLauren M Punam Broussard, PA-C 01/04/14 1655

## 2014-01-04 NOTE — ED Notes (Signed)
She is here for removal of stitches from L earlobe. Denies pain or signs of infection.

## 2014-04-06 ENCOUNTER — Encounter (HOSPITAL_COMMUNITY): Payer: Self-pay | Admitting: Emergency Medicine

## 2014-04-06 ENCOUNTER — Emergency Department (HOSPITAL_COMMUNITY)
Admission: EM | Admit: 2014-04-06 | Discharge: 2014-04-07 | Disposition: A | Discharge status: Home / Self Care | Payer: Medicare Other | Attending: Emergency Medicine | Admitting: Emergency Medicine

## 2014-04-06 ENCOUNTER — Emergency Department (HOSPITAL_COMMUNITY): Payer: Medicare Other

## 2014-04-06 DIAGNOSIS — S50909A Unspecified superficial injury of unspecified elbow, initial encounter: Secondary | ICD-10-CM | POA: Diagnosis not present

## 2014-04-06 DIAGNOSIS — F909 Attention-deficit hyperactivity disorder, unspecified type: Secondary | ICD-10-CM | POA: Insufficient documentation

## 2014-04-06 DIAGNOSIS — F17200 Nicotine dependence, unspecified, uncomplicated: Secondary | ICD-10-CM | POA: Insufficient documentation

## 2014-04-06 DIAGNOSIS — F319 Bipolar disorder, unspecified: Secondary | ICD-10-CM | POA: Diagnosis not present

## 2014-04-06 DIAGNOSIS — S199XXA Unspecified injury of neck, initial encounter: Secondary | ICD-10-CM

## 2014-04-06 DIAGNOSIS — X789XXA Intentional self-harm by unspecified sharp object, initial encounter: Secondary | ICD-10-CM | POA: Diagnosis not present

## 2014-04-06 DIAGNOSIS — S61409A Unspecified open wound of unspecified hand, initial encounter: Secondary | ICD-10-CM | POA: Diagnosis not present

## 2014-04-06 DIAGNOSIS — S0990XA Unspecified injury of head, initial encounter: Secondary | ICD-10-CM | POA: Diagnosis not present

## 2014-04-06 DIAGNOSIS — S50919A Unspecified superficial injury of unspecified forearm, initial encounter: Principal | ICD-10-CM

## 2014-04-06 DIAGNOSIS — S60919A Unspecified superficial injury of unspecified wrist, initial encounter: Principal | ICD-10-CM

## 2014-04-06 DIAGNOSIS — S0993XA Unspecified injury of face, initial encounter: Secondary | ICD-10-CM | POA: Diagnosis not present

## 2014-04-06 DIAGNOSIS — Z79899 Other long term (current) drug therapy: Secondary | ICD-10-CM | POA: Diagnosis not present

## 2014-04-06 DIAGNOSIS — S298XXA Other specified injuries of thorax, initial encounter: Secondary | ICD-10-CM | POA: Diagnosis not present

## 2014-04-06 DIAGNOSIS — T07XXXA Unspecified multiple injuries, initial encounter: Secondary | ICD-10-CM

## 2014-04-06 HISTORY — DX: Bipolar disorder, unspecified: F31.9

## 2014-04-06 HISTORY — DX: Attention-deficit hyperactivity disorder, unspecified type: F90.9

## 2014-04-06 HISTORY — DX: Other specified behavioral and emotional disorders with onset usually occurring in childhood and adolescence: F98.8
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
## 2014-04-06 NOTE — ED Notes (Signed)
MD at bedside. 

## 2014-08-13 ENCOUNTER — Encounter (HOSPITAL_COMMUNITY): Payer: Self-pay | Admitting: Emergency Medicine

## 2017-12-22 ENCOUNTER — Encounter (HOSPITAL_COMMUNITY): Payer: Self-pay | Admitting: Emergency Medicine

## 2017-12-22 ENCOUNTER — Emergency Department (HOSPITAL_COMMUNITY)
Admission: EM | Admit: 2017-12-22 | Discharge: 2017-12-22 | Disposition: A | Discharge status: Home / Self Care | Payer: Medicare Other | Attending: Emergency Medicine | Admitting: Emergency Medicine

## 2017-12-22 DIAGNOSIS — F17210 Nicotine dependence, cigarettes, uncomplicated: Secondary | ICD-10-CM | POA: Insufficient documentation

## 2017-12-22 DIAGNOSIS — M25561 Pain in right knee: Secondary | ICD-10-CM | POA: Diagnosis present

## 2017-12-22 DIAGNOSIS — M791 Myalgia, unspecified site: Secondary | ICD-10-CM | POA: Diagnosis not present

## 2017-12-22 DIAGNOSIS — M7918 Myalgia, other site: Secondary | ICD-10-CM

## 2017-12-22 DIAGNOSIS — F909 Attention-deficit hyperactivity disorder, unspecified type: Secondary | ICD-10-CM | POA: Insufficient documentation

## 2017-12-22 DIAGNOSIS — Z79899 Other long term (current) drug therapy: Secondary | ICD-10-CM | POA: Diagnosis not present

## 2017-12-22 LAB — POC URINE PREG, ED: Preg Test, Ur: NEGATIVE

## 2017-12-22 MED ORDER — IBUPROFEN 600 MG PO TABS: 600.0000 mg | ORAL_TABLET | Freq: Four times a day (QID) | ORAL | 0 refills | Status: AC | PRN
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
## 2017-12-22 NOTE — ED Notes (Signed)
Bed: WTR8 Expected date:  Expected time:  Means of arrival:  Comments: 

## 2019-01-31 ENCOUNTER — Encounter (HOSPITAL_COMMUNITY): Payer: Self-pay | Admitting: Emergency Medicine

## 2019-01-31 ENCOUNTER — Other Ambulatory Visit: Payer: Self-pay

## 2019-01-31 ENCOUNTER — Ambulatory Visit (HOSPITAL_COMMUNITY)
Admission: EM | Admit: 2019-01-31 | Discharge: 2019-01-31 | Disposition: A | Discharge status: Home / Self Care | Payer: Medicare Other | Attending: Family Medicine | Admitting: Family Medicine

## 2019-01-31 DIAGNOSIS — R0789 Other chest pain: Secondary | ICD-10-CM

## 2019-01-31 HISTORY — DX: Essential (primary) hypertension: I10

## 2019-01-31 MED ORDER — NAPROXEN 500 MG PO TABS: 500.0000 mg | ORAL_TABLET | Freq: Two times a day (BID) | ORAL | 0 refills | Status: DC

## 2019-01-31 NOTE — Discharge Instructions (Signed)
Please take naprosyn twice daily with food as needed for chest pain  Continue reflux medicines as needed  Follow up if chest pain worsening or changing
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
## 2019-01-31 NOTE — ED Triage Notes (Signed)
Pt sts CP worse with palpation and inspiration; pt from group home

## 2019-12-15 ENCOUNTER — Ambulatory Visit: Payer: Self-pay | Attending: Family

## 2019-12-15 DIAGNOSIS — Z23 Encounter for immunization: Secondary | ICD-10-CM

## 2019-12-15 NOTE — Progress Notes (Signed)
   Covid-19 Vaccination Clinic  Name:  Brooke Holt    MRN: [id number] DOB: [birth year]  12/15/2019  Brooke Holt was observed post Covid-19 immunization for 15 minutes without incident. She was provided with Vaccine Information Sheet and instruction to access the V-Safe system.   Brooke Holt was instructed to call 911 with any severe reactions post vaccine: Marland Kitchen Difficulty breathing  . Swelling of face and throat  . A fast heartbeat  . A bad rash all over body  . Dizziness and weakness   Immunizations Administered    Name Date Dose VIS Date Route   Moderna COVID-19 Vaccine 12/15/2019 11:25 AM 0.5 mL 08/09/2019 Intramuscular   Manufacturer: Moderna   Lot: [id number]   NDC: 97673-419-37

## 2020-01-05 ENCOUNTER — Other Ambulatory Visit: Payer: Self-pay

## 2020-01-05 ENCOUNTER — Emergency Department (HOSPITAL_COMMUNITY)
Admission: EM | Admit: 2020-01-05 | Discharge: 2020-01-05 | Disposition: A | Discharge status: Home / Self Care | Payer: Medicare Other | Attending: Emergency Medicine | Admitting: Emergency Medicine

## 2020-01-05 ENCOUNTER — Encounter (HOSPITAL_COMMUNITY): Payer: Self-pay

## 2020-01-05 DIAGNOSIS — Y998 Other external cause status: Secondary | ICD-10-CM | POA: Insufficient documentation

## 2020-01-05 DIAGNOSIS — W268XXA Contact with other sharp object(s), not elsewhere classified, initial encounter: Secondary | ICD-10-CM | POA: Insufficient documentation

## 2020-01-05 DIAGNOSIS — F17210 Nicotine dependence, cigarettes, uncomplicated: Secondary | ICD-10-CM | POA: Insufficient documentation

## 2020-01-05 DIAGNOSIS — I10 Essential (primary) hypertension: Secondary | ICD-10-CM | POA: Diagnosis not present

## 2020-01-05 DIAGNOSIS — S60512A Abrasion of left hand, initial encounter: Secondary | ICD-10-CM

## 2020-01-05 DIAGNOSIS — S6992XA Unspecified injury of left wrist, hand and finger(s), initial encounter: Secondary | ICD-10-CM | POA: Diagnosis present

## 2020-01-05 DIAGNOSIS — Z79899 Other long term (current) drug therapy: Secondary | ICD-10-CM | POA: Insufficient documentation

## 2020-01-05 DIAGNOSIS — F909 Attention-deficit hyperactivity disorder, unspecified type: Secondary | ICD-10-CM | POA: Insufficient documentation

## 2020-01-05 DIAGNOSIS — Y92013 Bedroom of single-family (private) house as the place of occurrence of the external cause: Secondary | ICD-10-CM | POA: Insufficient documentation

## 2020-01-05 DIAGNOSIS — Y9389 Activity, other specified: Secondary | ICD-10-CM | POA: Diagnosis not present
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
## 2020-01-05 NOTE — ED Notes (Signed)
Scratches covered with nonadherent bandage and covered with coban. Discharge instructions reviewed. Pt and caregiver understand and explain how to care for it.

## 2020-01-17 ENCOUNTER — Ambulatory Visit: Payer: Medicare Other | Attending: Family

## 2020-01-17 DIAGNOSIS — Z23 Encounter for immunization: Secondary | ICD-10-CM

## 2020-01-17 NOTE — Progress Notes (Signed)
   Covid-19 Vaccination Clinic  Name:  Brooke Holt    MRN: [id number] DOB: Sep 11, 1990  01/17/2020  Ms. Brooke Holt was observed post Covid-19 immunization for 15 minutes without incident. She was provided with Vaccine Information Sheet and instruction to access the V-Safe system.   Ms. Brooke Holt was instructed to call 911 with any severe reactions post vaccine: Marland Kitchen Difficulty breathing  . Swelling of face and throat  . A fast heartbeat  . A bad rash all over body  . Dizziness and weakness   Immunizations Administered    Name Date Dose VIS Date Route   Moderna COVID-19 Vaccine 01/17/2020 10:04 AM 0.5 mL 08/2019 Intramuscular   Manufacturer: Moderna   Lot: [id number]   NDC: 74451-460-47

## 2020-06-06 ENCOUNTER — Other Ambulatory Visit: Payer: Self-pay | Admitting: Neurology

## 2020-06-06 DIAGNOSIS — R519 Headache, unspecified: Secondary | ICD-10-CM

## 2020-06-06 DIAGNOSIS — Z79899 Other long term (current) drug therapy: Secondary | ICD-10-CM

## 2020-06-22 ENCOUNTER — Other Ambulatory Visit: Payer: Self-pay

## 2020-06-22 ENCOUNTER — Ambulatory Visit
Admission: RE | Admit: 2020-06-22 | Discharge: 2020-06-22 | Disposition: A | Discharge status: Home / Self Care | Payer: Medicare Other | Source: Ambulatory Visit | Attending: Neurology | Admitting: Neurology

## 2020-06-22 DIAGNOSIS — R519 Headache, unspecified: Secondary | ICD-10-CM | POA: Diagnosis present

## 2020-06-22 DIAGNOSIS — Z79899 Other long term (current) drug therapy: Secondary | ICD-10-CM | POA: Insufficient documentation

## 2020-06-22 MED ORDER — GADOBUTROL 1 MMOL/ML IV SOLN
7.5000 mL | Freq: Once | INTRAVENOUS | Status: AC | PRN
  Administered 2020-06-22: 7.5 mL via INTRAVENOUS

## 2021-03-22 IMAGING — MR MR HEAD WO/W CM
12 series · 48 of 48 positions shown · IV contrast (gadavist)
Comparison: None.

CLINICAL DATA: Headache disorder.  High risk medication use.

EXAM:
MRI HEAD WITHOUT AND WITH CONTRAST
TECHNIQUE: Multiplanar, multiecho pulse sequences of the brain and surrounding
structures were obtained without and with intravenous contrast.
CONTRAST:  7.5mL GADAVIST GADOBUTROL 1 MMOL/ML IV SOLN

[Series 5: T1 · sagittal · 5.0mm · 0.62mm/px · 1 of 25 slices shown (1 of 2)]
[im 1/25]
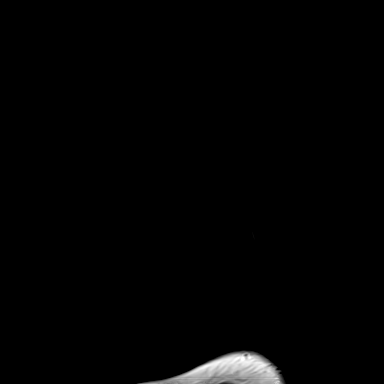

[Series 6: ax dwi_tracew · axial · 3.0mm · 0.71mm/px · z∈[-147,+14]mm · 3 of 56 slices shown]
[im 1/56]
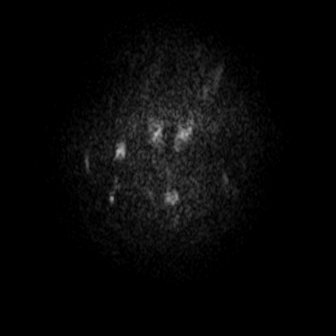
[im 28/56]
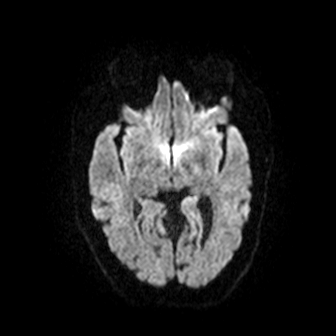
[im 56/56]
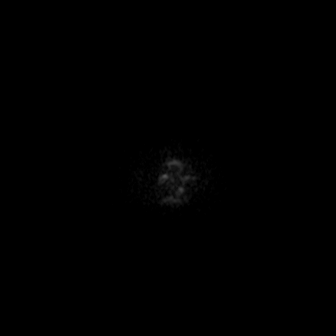

[Series 7: ax dwi_adc · axial · 3.0mm · 0.71mm/px · z∈[-147,+14]mm · 4 of 56 slices shown]
[im 1/56]
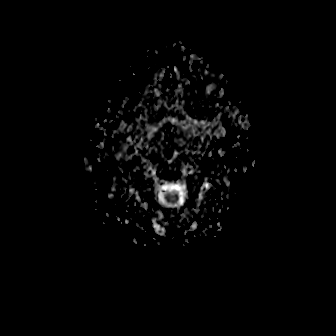
[im 19/56]
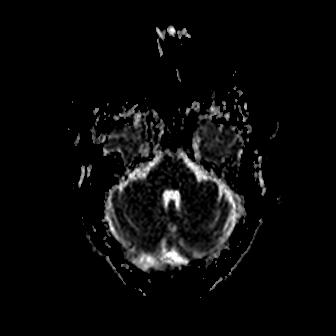
[im 37/56]
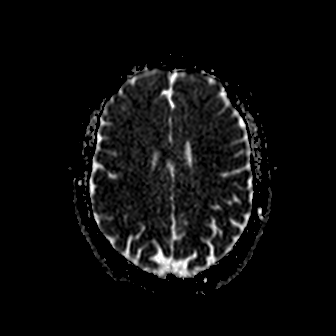
[im 56/56]
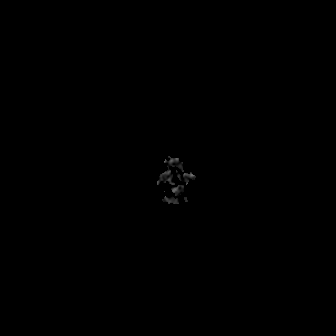

[Series 8: cor dwi_tracew · coronal · 5.0mm · 0.68mm/px · 3 of 40 slices shown]
[im 1/40]
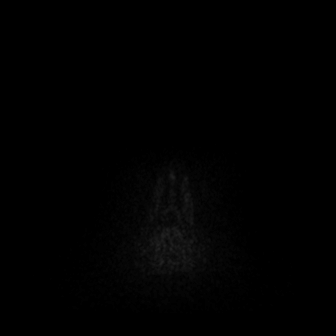
[im 20/40]
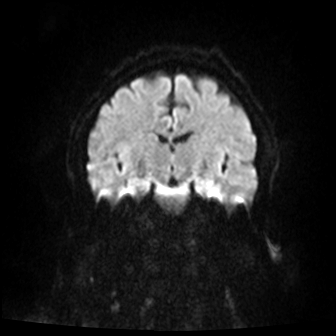
[im 40/40]
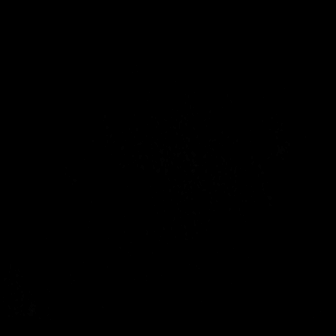

[Series 9: cor dwi_adc · coronal · 5.0mm · 0.68mm/px · 3 of 39 slices shown]
[im 1/39]
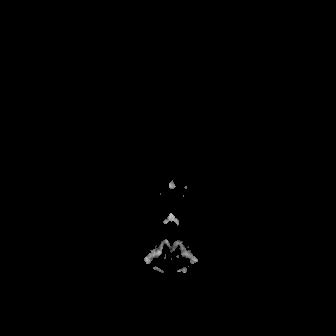
[im 20/39]
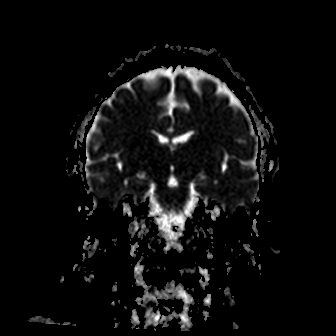
[im 39/39]
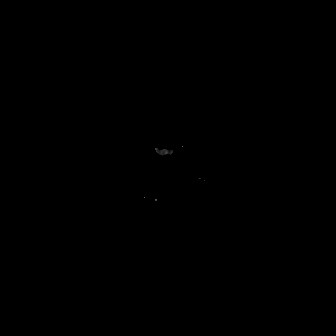

[Series 10: T2 · axial · 5.0mm · 0.45mm/px · z∈[-135,+19]mm · 2 of 27 slices shown (1 of 2)]
[im 1/27]
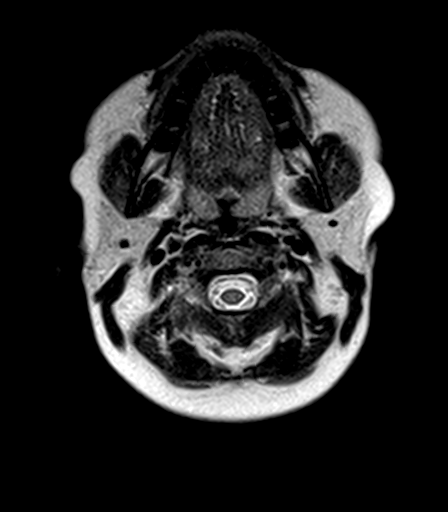
[im 27/27]
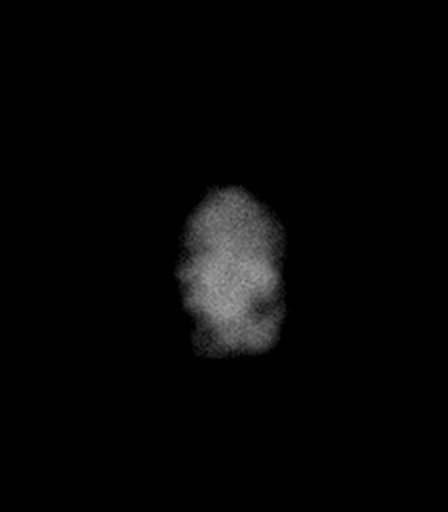

[Series 11: T2-star · axial · 5.0mm · 0.45mm/px · z∈[-135,+19]mm · 2 of 27 slices shown]
[im 1/27]
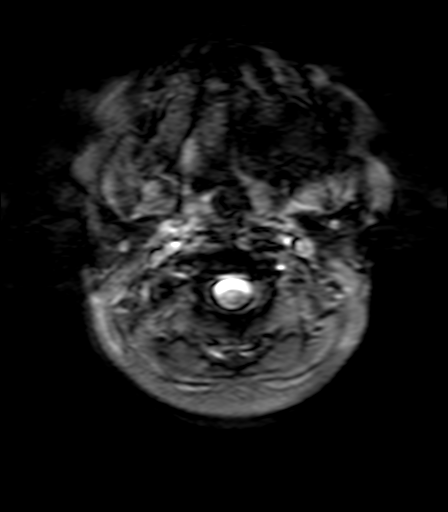
[im 27/27]
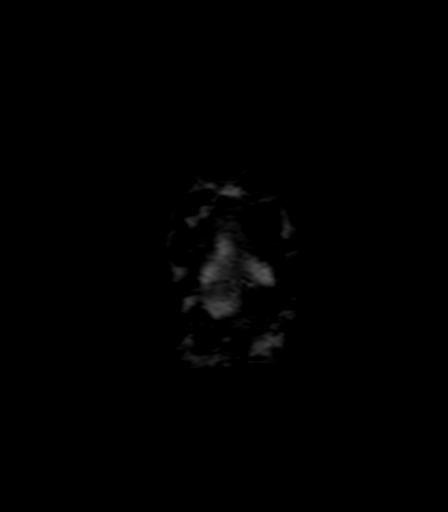

[Series 12: FLAIR · axial · 5.0mm · 1.20mm/px · z∈[-135,+19]mm · 2 of 27 slices shown]
[im 1/27]
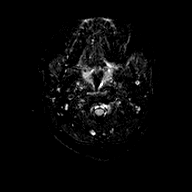
[im 27/27]
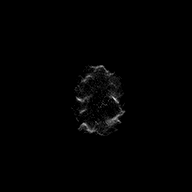

[Series 13: T1 · axial · 1.0mm · 0.98mm/px · z∈[-146,+27]mm · 12 of 176 slices shown (2 of 2)]
[im 1/176]
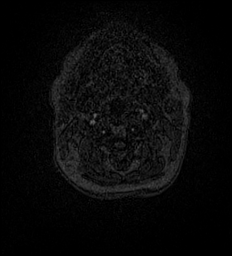
[im 16/176]
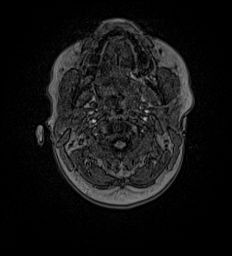
[im 32/176]
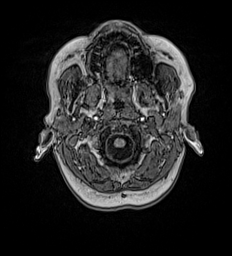
[im 48/176]
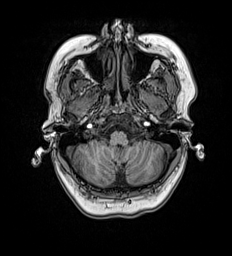
[im 64/176]
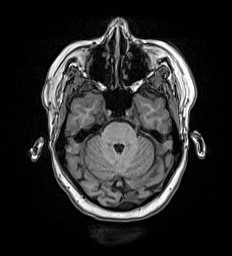
[im 80/176]
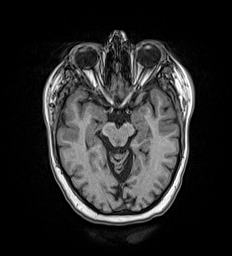
[im 96/176]
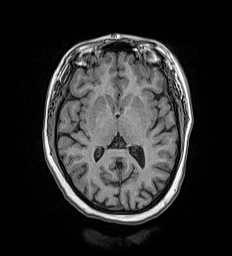
[im 112/176]
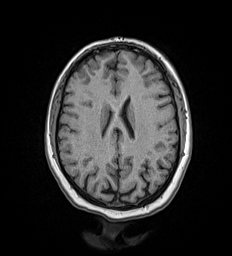
[im 128/176]
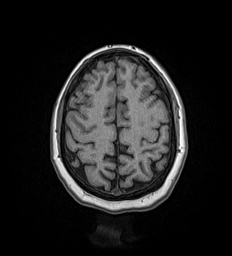
[im 144/176]
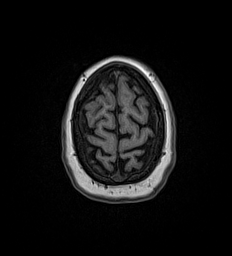
[im 160/176]
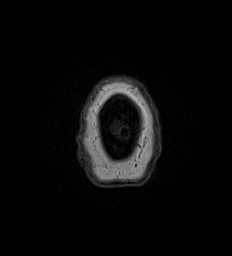
[im 176/176]
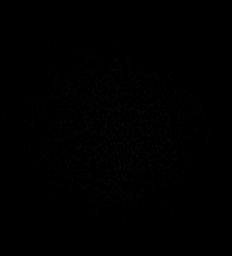

[Series 14: T2 · coronal · 5.0mm · 0.45mm/px · 2 of 31 slices shown (2 of 2)]
[im 1/31]
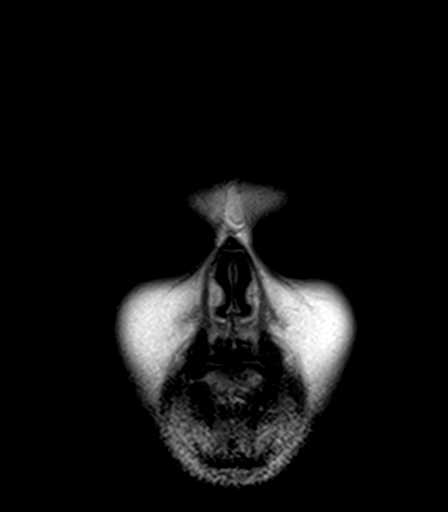
[im 31/31]
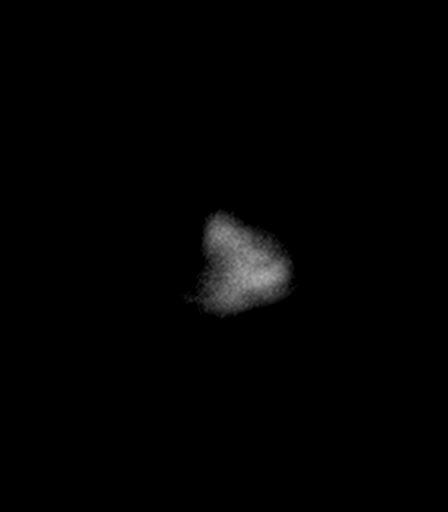

[Series 15: T1 post-contrast · axial · 1.0mm · 0.98mm/px · z∈[-146,+27]mm · 12 of 176 slices shown (1 of 2)]
[im 1/176]
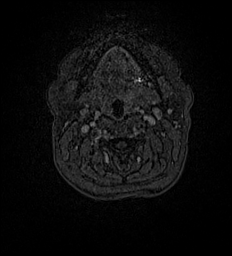
[im 16/176]
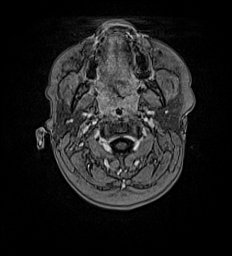
[im 32/176]
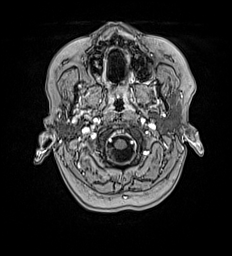
[im 48/176]
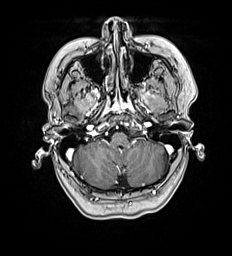
[im 64/176]
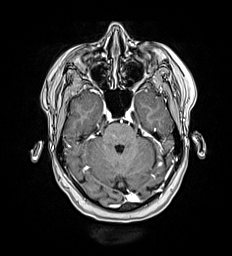
[im 80/176]
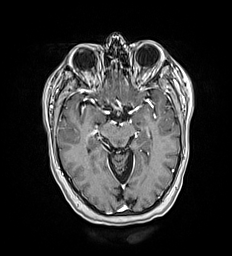
[im 96/176]
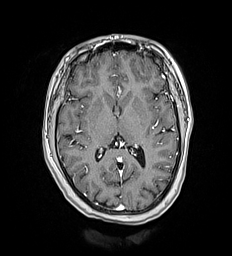
[im 112/176]
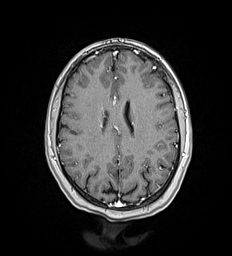
[im 128/176]
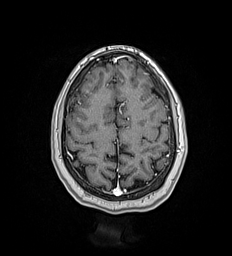
[im 144/176]
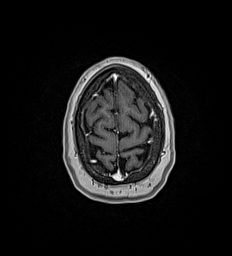
[im 160/176]
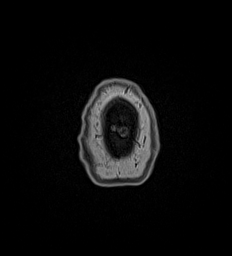
[im 176/176]
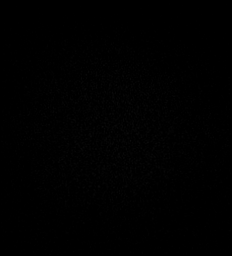

[Series 16: T1 post-contrast · coronal · 5.0mm · 0.90mm/px · 2 of 31 slices shown (2 of 2)]
[im 1/31]
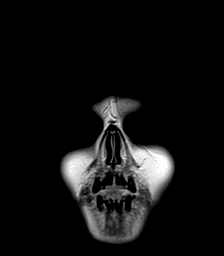
[im 31/31]
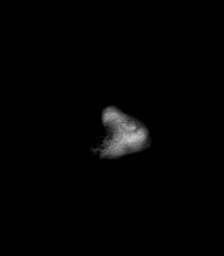

[48 of 48 positions shown; findings below may reference images not displayed]

FINDINGS: Brain: No acute infarction, hemorrhage, hydrocephalus, extra-axial
collection or mass lesion.

Mild prominence of the cerebral sulci in the bilateral
parietooccipital region with mild increased T2 signal within the
white matter adjacent to the occipital horn of the lateral
ventricles.

Vascular: Normal flow voids.

Skull and upper cervical spine: Normal marrow signal.

Sinuses/Orbits: Mucosal thickening of the bilateral maxillary
sinuses. The orbits are maintained.

Other: None.
IMPRESSION: 1. No acute intracranial abnormality.
2. Mild prominence of the cerebral sulci in the bilateral
parietooccipital region with mild increased T2 signal within the
white matter adjacent to the occipital horn of the lateral
ventricles. Findings may represent mild cerebral volume loss with
periventricular leukomalacia. Correlate with perinatal history.

## 2023-06-05 ENCOUNTER — Emergency Department (HOSPITAL_COMMUNITY): Payer: Medicare Other

## 2023-06-05 ENCOUNTER — Emergency Department (HOSPITAL_COMMUNITY)
Admission: EM | Admit: 2023-06-05 | Discharge: 2023-06-06 | Disposition: A | Discharge status: Home / Self Care | Payer: Medicare Other | Attending: Emergency Medicine | Admitting: Emergency Medicine

## 2023-06-05 ENCOUNTER — Other Ambulatory Visit: Payer: Self-pay

## 2023-06-05 DIAGNOSIS — I10 Essential (primary) hypertension: Secondary | ICD-10-CM | POA: Insufficient documentation

## 2023-06-05 DIAGNOSIS — R0789 Other chest pain: Secondary | ICD-10-CM | POA: Diagnosis present

## 2023-06-05 LAB — BASIC METABOLIC PANEL
Anion gap: 7 (ref 5–15)
BUN: 13 mg/dL (ref 6–20)
CO2: 22 mmol/L (ref 22–32)
Calcium: 8.3 mg/dL — ABNORMAL LOW (ref 8.9–10.3)
Chloride: 110 mmol/L (ref 98–111)
Creatinine, Ser: 0.94 mg/dL (ref 0.44–1.00)
GFR, Estimated: >60 mL/min (ref >60)
Glucose, Bld: 120 mg/dL — ABNORMAL HIGH (ref 70–99)
Potassium: 3.4 mmol/L — ABNORMAL LOW (ref 3.5–5.1)
Sodium: 139 mmol/L (ref 135–145)

## 2023-06-05 LAB — CBC WITH DIFFERENTIAL/PLATELET
Abs Immature Granulocytes: 0.02 10*3/uL (ref 0.00–0.07)
Basophils Absolute: 0 10*3/uL (ref 0.0–0.1)
Basophils Relative: 1 %
Eosinophils Absolute: 0.1 10*3/uL (ref 0.0–0.5)
Eosinophils Relative: 1 %
HCT: 43.7 % (ref 36.0–46.0)
Hemoglobin: 14.6 g/dL (ref 12.0–15.0)
Immature Granulocytes: 0 %
Lymphocytes Relative: 33 %
Lymphs Abs: 2.6 10*3/uL (ref 0.7–4.0)
MCH: 32.4 pg (ref 26.0–34.0)
MCHC: 33.4 g/dL (ref 30.0–36.0)
MCV: 97.1 fL (ref 80.0–100.0)
Monocytes Absolute: 0.5 10*3/uL (ref 0.1–1.0)
Monocytes Relative: 7 %
Neutro Abs: 4.7 10*3/uL (ref 1.7–7.7)
Neutrophils Relative %: 58 %
Platelets: 193 10*3/uL (ref 150–400)
RBC: 4.5 MIL/uL (ref 3.87–5.11)
RDW: 12 % (ref 11.5–15.5)
WBC: 8 10*3/uL (ref 4.0–10.5)
nRBC: 0 % (ref 0.0–0.2)

## 2023-06-05 LAB — TROPONIN I (HIGH SENSITIVITY): Troponin I (High Sensitivity): <2 ng/L (ref <18)

## 2023-06-05 MED ORDER — IBUPROFEN 800 MG PO TABS
800.0000 mg | ORAL_TABLET | Freq: Once | ORAL | Status: AC
  Administered 2023-06-05: 800 mg via ORAL
  Filled 2023-06-05: qty 1

## 2023-06-05 NOTE — ED Triage Notes (Signed)
Pt BIB [facility] d/t pt concerns for chest pain. Pt reports left upper chest pain that is tender to touch. Denies heavy lifting / injury. No associated s/s. No SOB/NV/Lightheaded.

## 2023-06-05 NOTE — ED Notes (Signed)
Pt's mother Darel Hong called pt on phone in the room. The RN was collecting blood at this time. Pt requested RN to speak to mother. Pts mother was informed pt arrived here via EMS c/o chest pain. That labs have been collected and there are no further update at this time. Pt's mother also informed no staff from group home at bedside at this time. Pt speaking to mother on the phone at this time

## 2023-06-05 NOTE — ED Provider Notes (Signed)
[place] EMERGENCY DEPARTMENT AT [facility] Provider Note   CSN: [id number] Arrival date & time: 06/05/23  2159     History {Add pertinent medical, surgical, social history, OB history to HPI:1} Chief Complaint  Patient presents with   Chest Pain    Traniya Myer Haff is a 32 y.o. female.  The history is provided by the patient and medical records.  Chest Pain  32 y.o. F with history of depression, ADHD, hypertension, bipolar disorder, presenting to the ED for chest pain.  Reports 2 days ago she developed a "lump" in the center of her chest.  This has been very tender to palpation.  She has not noticed any redness but feels like it is swelling.  She denies any difficulty breathing, fever, or chills.  She has not had any cough or upper respiratory symptoms.  She denies any heavy lifting, did strain a little bit a few days ago when trying to pull some clothing out of the dryer that was twisted up but did not feel any pain at that time.  She denies any chest trauma.  Home Medications Prior to Admission medications   Medication Sig Start Date End Date Taking? Authorizing Provider  ARIPiprazole (ABILIFY) 30 MG tablet Take 45 mg by mouth daily. Pt takes 1 and 1/2 tab of 30 mg to equal 45mg  dose.    [provider]  ARIPiprazole (ABILIFY) 30 MG tablet Take 30 mg by mouth daily.    [provider]  atomoxetine (STRATTERA) 40 MG capsule Take 40 mg by mouth daily.    [provider]  atomoxetine (STRATTERA) 40 MG capsule Take 40 mg by mouth daily.    [provider]  FLUoxetine (PROZAC) 10 MG capsule Take 5 mg by mouth daily.    [provider]  FLUoxetine (PROZAC) 10 MG tablet Take 5 mg by mouth daily. Pt takes 1/2 tab for 5 mg dose    [provider]  ibuprofen (ADVIL,MOTRIN) 600 MG tablet Take 1 tablet (600 mg total) by mouth every 6 (six) hours as needed. 12/22/17   Elson Areas, PA-C  levonorgestrel-ethinyl estradiol  (NORDETTE) 0.15-30 MG-MCG tablet Take 1 tablet by mouth daily.    [provider]  naproxen (NAPROSYN) 500 MG tablet Take 1 tablet (500 mg total) by mouth 2 (two) times daily. 01/31/19   Wieters, Hallie C, PA-C  propranolol (INDERAL) 60 MG tablet Take 60 mg by mouth 2 (two) times daily.     [provider]  propranolol (INDERAL) 60 MG tablet Take 60 mg by mouth 2 (two) times daily.    [provider]  ranitidine (ZANTAC) 150 MG tablet Take 150 mg by mouth 2 (two) times daily.    [provider]  ranitidine (ZANTAC) 150 MG tablet Take 150 mg by mouth daily.    [provider]  trihexyphenidyl (ARTANE) 2 MG tablet Take 2 mg by mouth daily.    [provider]      Allergies    Seroquel [quetiapine]    Review of Systems   Review of Systems  Cardiovascular:  Positive for chest pain.  All other systems reviewed and are negative.   Physical Exam Updated Vital Signs BP 106/69   Pulse 79   Temp 97.6 F (36.4 C) (Oral)   Resp (!) 23   SpO2 96%   Physical Exam Vitals and nursing note reviewed.  Constitutional:      Appearance: She is well-developed.  HENT:  Head: Normocephalic and atraumatic.  Eyes:     Conjunctiva/sclera: Conjunctivae normal.     Pupils: Pupils are equal, round, and reactive to light.  Cardiovascular:     Rate and Rhythm: Normal rate and regular rhythm.     Heart sounds: Normal heart sounds.  Pulmonary:     Effort: Pulmonary effort is normal.     Breath sounds: Normal breath sounds.  Chest:     Comments: Tenderness along manubrium portion of sternum, there does appear to be some very mild swelling without overlying erythema, induration, or other infectious findings Abdominal:     General: Bowel sounds are normal.     Palpations: Abdomen is soft.  Musculoskeletal:        General: Normal range of motion.     Cervical back: Normal range of motion.  Skin:    General: Skin is warm and dry.  Neurological:      Mental Status: She is alert and oriented to person, place, and time.     ED Results / Procedures / Treatments   Labs (all labs ordered are listed, but only abnormal results are displayed) Labs Reviewed - No data to display  EKG None  Radiology No results found.  Procedures Procedures  {Document cardiac monitor, telemetry assessment procedure when appropriate:1}  Medications Ordered in ED Medications - No data to display  ED Course/ Medical Decision Making/ A&P   {   Click here for ABCD2, HEART and other calculatorsREFRESH Note before signing :1}                              Medical Decision Making Amount and/or Complexity of Data Reviewed Labs: ordered. Radiology: ordered.   ***  {Document critical care time when appropriate:1} {Document review of labs and clinical decision tools ie heart score, Chads2Vasc2 etc:1}  {Document your independent review of radiology images, and any outside records:1} {Document your discussion with family members, caretakers, and with consultants:1} {Document social determinants of health affecting pt's care:1} {Document your decision making why or why not admission, treatments were needed:1} Final Clinical Impression(s) / ED Diagnoses Final diagnoses:  None    Rx / DC Orders ED Discharge Orders     None

## 2023-06-06 MED ORDER — NAPROXEN 500 MG PO TABS: 500.0000 mg | ORAL_TABLET | Freq: Two times a day (BID) | ORAL | 0 refills | Status: AC

## 2023-06-06 NOTE — Discharge Instructions (Signed)
Take the prescribed medication as directed. °Follow-up with your primary care doctor. °Return to the ED for new or worsening symptoms. °

## 2023-06-06 NOTE — ED Notes (Signed)
Pts legal guardian Theresia Lo Called at [phone] regarding pt discharge. Legal Guardian unable to pick up pt till after 6 am. Pt updated.

## 2023-06-06 NOTE — ED Notes (Addendum)
Spoke with Hyman Bible confirmed she sent [facility] Face to pick up pt. Pt departing with group home staff at this time.

## 2023-07-08 ENCOUNTER — Other Ambulatory Visit: Payer: Self-pay | Admitting: Certified Nurse Midwife

## 2023-07-08 DIAGNOSIS — N6310 Unspecified lump in the right breast, unspecified quadrant: Secondary | ICD-10-CM

## 2023-07-17 ENCOUNTER — Other Ambulatory Visit: Payer: Self-pay | Admitting: Certified Nurse Midwife

## 2023-07-17 DIAGNOSIS — N6310 Unspecified lump in the right breast, unspecified quadrant: Secondary | ICD-10-CM

## 2023-07-27 ENCOUNTER — Other Ambulatory Visit: Payer: Self-pay | Admitting: Certified Nurse Midwife

## 2023-07-27 DIAGNOSIS — N6310 Unspecified lump in the right breast, unspecified quadrant: Secondary | ICD-10-CM

## 2023-08-18 ENCOUNTER — Ambulatory Visit
Admission: RE | Admit: 2023-08-18 | Discharge: 2023-08-18 | Disposition: A | Discharge status: Home / Self Care | Payer: Medicare Other | Source: Ambulatory Visit | Attending: Certified Nurse Midwife | Admitting: Certified Nurse Midwife

## 2023-08-18 DIAGNOSIS — N6310 Unspecified lump in the right breast, unspecified quadrant: Secondary | ICD-10-CM
